# Patient Record
Sex: Female | Born: 1970 | Race: Black or African American | Hispanic: No | Marital: Married | Smoking: Never smoker
Health system: Southern US, Community
[De-identification: ages and names within clinical notes are randomized; demographics above are authoritative.]

## PROBLEM LIST (undated history)

## (undated) DIAGNOSIS — G7000 Myasthenia gravis without (acute) exacerbation: Secondary | ICD-10-CM

---

## 2003-02-16 HISTORY — PX: ABDOMINAL HYSTERECTOMY: SHX81

## 2013-02-15 HISTORY — PX: HERNIA REPAIR: SHX51

## 2013-10-18 ENCOUNTER — Encounter (HOSPITAL_BASED_OUTPATIENT_CLINIC_OR_DEPARTMENT_OTHER): Payer: Self-pay | Admitting: Emergency Medicine

## 2013-10-18 ENCOUNTER — Emergency Department (HOSPITAL_BASED_OUTPATIENT_CLINIC_OR_DEPARTMENT_OTHER)
Admission: EM | Admit: 2013-10-18 | Discharge: 2013-10-18 | Disposition: A | Discharge status: Home / Self Care | Payer: Medicare Other | Attending: Emergency Medicine | Admitting: Emergency Medicine

## 2013-10-18 ENCOUNTER — Emergency Department (HOSPITAL_BASED_OUTPATIENT_CLINIC_OR_DEPARTMENT_OTHER): Payer: Medicare Other

## 2013-10-18 DIAGNOSIS — Y92838 Other recreation area as the place of occurrence of the external cause: Secondary | ICD-10-CM

## 2013-10-18 DIAGNOSIS — Y9367 Activity, basketball: Secondary | ICD-10-CM | POA: Insufficient documentation

## 2013-10-18 DIAGNOSIS — S4980XA Other specified injuries of shoulder and upper arm, unspecified arm, initial encounter: Secondary | ICD-10-CM | POA: Diagnosis present

## 2013-10-18 DIAGNOSIS — Z8669 Personal history of other diseases of the nervous system and sense organs: Secondary | ICD-10-CM | POA: Diagnosis not present

## 2013-10-18 DIAGNOSIS — Z792 Long term (current) use of antibiotics: Secondary | ICD-10-CM | POA: Diagnosis not present

## 2013-10-18 DIAGNOSIS — S63509A Unspecified sprain of unspecified wrist, initial encounter: Secondary | ICD-10-CM | POA: Insufficient documentation

## 2013-10-18 DIAGNOSIS — Y9239 Other specified sports and athletic area as the place of occurrence of the external cause: Secondary | ICD-10-CM | POA: Diagnosis not present

## 2013-10-18 DIAGNOSIS — S63502A Unspecified sprain of left wrist, initial encounter: Secondary | ICD-10-CM

## 2013-10-18 DIAGNOSIS — W219XXA Striking against or struck by unspecified sports equipment, initial encounter: Secondary | ICD-10-CM | POA: Insufficient documentation

## 2013-10-18 DIAGNOSIS — S46909A Unspecified injury of unspecified muscle, fascia and tendon at shoulder and upper arm level, unspecified arm, initial encounter: Secondary | ICD-10-CM | POA: Insufficient documentation

## 2013-10-18 HISTORY — DX: Myasthenia gravis without (acute) exacerbation: G70.00

## 2013-10-18 MED ORDER — HYDROCODONE-ACETAMINOPHEN 5-325 MG PO TABS
1.0000 | ORAL_TABLET | Freq: Once | ORAL | Status: AC
  Administered 2013-10-18: 1 via ORAL
  Filled 2013-10-18: qty 1

## 2013-10-18 MED ORDER — IBUPROFEN 800 MG PO TABS
800.0000 mg | ORAL_TABLET | Freq: Once | ORAL | Status: AC
  Administered 2013-10-18: 800 mg via ORAL
  Filled 2013-10-18: qty 1

## 2013-10-18 MED ORDER — IBUPROFEN 800 MG PO TABS: 800.0000 mg | ORAL_TABLET | Freq: Three times a day (TID) | ORAL | Status: DC

## 2013-10-18 MED ORDER — HYDROCODONE-ACETAMINOPHEN 5-325 MG PO TABS: 1.0000 | ORAL_TABLET | ORAL | Status: DC | PRN

## 2013-10-18 NOTE — ED Notes (Signed)
Pt with h/o myastenia gravis , playing ball with son this pm, legs gave out and pt fell on left arm

## 2013-10-18 NOTE — ED Notes (Signed)
Pt legs gave way last pm  Fell  Abrasion to left shoulder, bilateral knees and pain to left hand and wrist w slight swelling

## 2013-10-18 NOTE — ED Provider Notes (Signed)
CSN: [id number]     Arrival date & time 10/18/13  0301 History   First MD Initiated Contact with Patient 10/18/13 0310     Chief Complaint  Patient presents with  . Arm Pain     (Consider location/radiation/quality/duration/timing/severity/associated sxs/prior Treatment) HPI  Patient presents to the ER for evaluation of left hand and wrist injury. Patient reports that she fell while playing basketball this evening. She had pain in the hand and wrist after the fall, but it was tolerable. She reports that she woke up this morning, however, with increased pain. Pain is constant, moderate to severe. It worsens when she tries to move the wrist.  Patient reports that she fell because her legs gave out. She does have a history of myasthenia gravis. There was no head injury. Patient denies neck and back pain. She does have scrapes on both of her knees, but the knees do not hurt.  Past Medical History  Diagnosis Date  . Myasthenia gravis    History reviewed. No pertinent past surgical history. History reviewed. No pertinent family history. History  Substance Use Topics  . Smoking status: Never Smoker   . Smokeless tobacco: Not on file  . Alcohol Use: No   OB History   Grav Para Term Preterm Abortions TAB SAB Ect Mult Living                 Review of Systems  Musculoskeletal: Positive for arthralgias. Negative for back pain and neck pain.  Skin:       Abrasion  All other systems reviewed and are negative.     Allergies  Review of patient's allergies indicates no known allergies.  Home Medications   Prior to Admission medications   Medication Sig Start Date End Date Taking? Authorizing Provider  doxycycline (VIBRAMYCIN) 50 MG capsule Take 50 mg by mouth 2 (two) times daily.   Yes Historical Provider, MD   BP 141/92  Pulse 92  Temp(Src) 98.8 F (37.1 C) (Oral)  Resp 18  Ht  (1.702 m)  Wt 158 lb (71.668 kg)  BMI 24.74 kg/m2  SpO2 100% Physical Exam  Constitutional:  She is oriented to person, place, and time. She appears well-developed and well-nourished. No distress.  HENT:  Head: Normocephalic and atraumatic.  Right Ear: Hearing normal.  Left Ear: Hearing normal.  Nose: Nose normal.  Mouth/Throat: Oropharynx is clear and moist and mucous membranes are normal.  Eyes: Conjunctivae and EOM are normal. Pupils are equal, round, and reactive to light.  Neck: Normal range of motion. Neck supple. No spinous process tenderness and no muscular tenderness present.  Cardiovascular: Regular rhythm, S1 normal and S2 normal.  Exam reveals no gallop and no friction rub.   No murmur heard. Pulmonary/Chest: Effort normal and breath sounds normal. No respiratory distress. She exhibits no tenderness.  Abdominal: Soft. Normal appearance and bowel sounds are normal. There is no hepatosplenomegaly. There is no tenderness. There is no rebound, no guarding, no tenderness at McBurney's point and negative Murphy's sign. No hernia.  Musculoskeletal: Normal range of motion.       Left wrist: She exhibits tenderness. She exhibits no swelling and no deformity.       Right knee: She exhibits normal range of motion, no swelling, no effusion, no ecchymosis and no deformity. No tenderness found.       Left knee: She exhibits normal range of motion, no swelling, no effusion, no ecchymosis and no deformity. No tenderness found.  Arms:      Left hand: She exhibits tenderness. She exhibits normal capillary refill, no deformity, no laceration and no swelling.       Hands: Neurological: She is alert and oriented to person, place, and time. She has normal strength. No cranial nerve deficit or sensory deficit. Coordination normal. GCS eye subscore is 4. GCS verbal subscore is 5. GCS motor subscore is 6.  Skin: Skin is warm and dry. Abrasion noted. No rash noted. No cyanosis.     Psychiatric: She has a normal mood and affect. Her speech is normal and behavior is normal. Thought content  normal.    ED Course  Procedures (including critical care time) Labs Review Labs Reviewed - No data to display  Imaging Review Dg Wrist Complete Left  10/18/2013   CLINICAL DATA:  Fall, left hand and wrist pain.  EXAM: LEFT HAND - COMPLETE 3+ VIEW; LEFT WRIST - COMPLETE 3+ VIEW  COMPARISON:  None.  FINDINGS: Left hand: Diffuse osteopenia. Mild first CMC DJD. No displaced acute fracture or dislocation.  Left wrist: No displaced fracture or dislocation. Diffuse osteopenia. No aggressive osseous lesion.  IMPRESSION: Mild first CMC DJD.  Diffuse osteopenia. No acute osseous finding of the left hand or wrist.  Recommend a repeat radiograph in 7-10 days if concern for acute fracture persists, to evaluate for interval change or callus formation.   Electronically Signed   By: Jearld Lesch M.D.   On: 10/18/2013 04:20   Dg Hand Complete Left  10/18/2013   CLINICAL DATA:  Fall, left hand and wrist pain.  EXAM: LEFT HAND - COMPLETE 3+ VIEW; LEFT WRIST - COMPLETE 3+ VIEW  COMPARISON:  None.  FINDINGS: Left hand: Diffuse osteopenia. Mild first CMC DJD. No displaced acute fracture or dislocation.  Left wrist: No displaced fracture or dislocation. Diffuse osteopenia. No aggressive osseous lesion.  IMPRESSION: Mild first CMC DJD.  Diffuse osteopenia. No acute osseous finding of the left hand or wrist.  Recommend a repeat radiograph in 7-10 days if concern for acute fracture persists, to evaluate for interval change or callus formation.   Electronically Signed   By: Jearld Lesch M.D.   On: 10/18/2013 04:20     EKG Interpretation None      MDM   Final diagnoses:  None   left wrist sprain, rule out occult fracture  Patient presents to the ER for evaluation of pain and tenderness of the left wrist and hand after a fall. There was no deformity noted. Patient is neurovascularly intact. Examination does reveal mild snuff box tenderness. X-ray does not show any acute fracture in the hand or wrist. Based on  the amount of pain in the areas of tenderness, cannot rule out occult fracture. Patient placed in a thumb spica splint, will followup with hand surgery or sports medicine for recheck of possible repeat imaging if not improving.    Gilda Crease, MD 10/18/13 [phone]

## 2013-10-18 NOTE — Discharge Instructions (Signed)
Sprain °A sprain is a tear in one of the strong, fibrous tissues that connect your bones (ligaments). The severity of the sprain depends on how much of the ligament is torn. The tear can be either partial or complete. °CAUSES  °Often, sprains are a result of a fall or an injury. The force of the impact causes the fibers of your ligament to stretch beyond their normal length. This excess tension causes the fibers of your ligament to tear. °SYMPTOMS  °You may have some loss of motion or increased pain within your normal range of motion. Other symptoms include: °· Bruising. °· Tenderness. °· Swelling. °DIAGNOSIS  °In order to diagnose a sprain, your caregiver will physically examine you to determine how torn the ligament is. Your caregiver may also suggest an X-ray exam to make sure no bones are broken. °TREATMENT  °If your ligament is only partially torn, treatment usually involves keeping the injured area in a fixed position (immobilization) for a short period. To do this, your caregiver will apply a bandage, cast, or splint to keep the area from moving until it heals. For a partially torn ligament, the healing process usually takes 2 to 3 weeks. °If your ligament is completely torn, you may need surgery to reconnect the ligament to the bone or to reconstruct the ligament. After surgery, a cast or splint may be applied and will need to stay on for 4 to 6 weeks while your ligament heals. °HOME CARE INSTRUCTIONS °· Keep the injured area elevated to decrease swelling. °· To ease pain and swelling, apply ice to your joint twice a day, for 2 to 3 days. °¨ Put ice in a plastic bag. °¨ Place a towel between your skin and the bag. °¨ Leave the ice on for 15 minutes. °· Only take over-the-counter or prescription medicine for pain as directed by your caregiver. °· Do not leave the injured area unprotected until pain and stiffness go away (usually 3 to 4 weeks). °· Do not allow your cast or splint to get wet. Cover your cast or  splint with a plastic bag when you shower or bathe. Do not swim. °· Your caregiver may suggest exercises for you to do during your recovery to prevent or limit permanent stiffness. °SEEK IMMEDIATE MEDICAL CARE IF: °· Your cast or splint becomes damaged. °· Your pain becomes worse. °MAKE SURE YOU: °· Understand these instructions. °· Will watch your condition. °· Will get help right away if you are not doing well or get worse. °Document Released: 01/30/2000 Document Revised: 04/26/2011 Document Reviewed: 02/13/2011 °Exit[employer]® Patient Information ©2015 [employer]. This information is not intended to replace advice given to you by your health care provider. Make sure you discuss any questions you have with your health care provider. ° °

## 2014-02-15 HISTORY — PX: THYMECTOMY: SHX1063

## 2015-07-09 ENCOUNTER — Emergency Department (HOSPITAL_BASED_OUTPATIENT_CLINIC_OR_DEPARTMENT_OTHER)
Admission: EM | Admit: 2015-07-09 | Discharge: 2015-07-09 | Disposition: A | Discharge status: Home / Self Care | Payer: Medicare Other | Attending: Emergency Medicine | Admitting: Emergency Medicine

## 2015-07-09 ENCOUNTER — Emergency Department (HOSPITAL_BASED_OUTPATIENT_CLINIC_OR_DEPARTMENT_OTHER): Payer: Medicare Other

## 2015-07-09 ENCOUNTER — Encounter (HOSPITAL_BASED_OUTPATIENT_CLINIC_OR_DEPARTMENT_OTHER): Payer: Self-pay | Admitting: Emergency Medicine

## 2015-07-09 DIAGNOSIS — Z79899 Other long term (current) drug therapy: Secondary | ICD-10-CM | POA: Diagnosis not present

## 2015-07-09 DIAGNOSIS — R1031 Right lower quadrant pain: Secondary | ICD-10-CM

## 2015-07-09 DIAGNOSIS — N83209 Unspecified ovarian cyst, unspecified side: Secondary | ICD-10-CM

## 2015-07-09 DIAGNOSIS — N83201 Unspecified ovarian cyst, right side: Secondary | ICD-10-CM | POA: Diagnosis not present

## 2015-07-09 LAB — COMPREHENSIVE METABOLIC PANEL
ALBUMIN: 3.9 g/dL (ref 3.5–5.0)
ALT: 17 U/L (ref 14–54)
ANION GAP: 7 (ref 5–15)
AST: 17 U/L (ref 15–41)
Alkaline Phosphatase: 61 U/L (ref 38–126)
BUN: 9 mg/dL (ref 6–20)
CO2: 27 mmol/L (ref 22–32)
CREATININE: 0.89 mg/dL (ref 0.44–1.00)
Calcium: 9 mg/dL (ref 8.9–10.3)
Chloride: 103 mmol/L (ref 101–111)
GFR calc Af Amer: >60 mL/min (ref >60)
GFR calc non Af Amer: >60 mL/min (ref >60)
Glucose, Bld: 109 mg/dL — ABNORMAL HIGH (ref 65–99)
Potassium: 3.2 mmol/L — ABNORMAL LOW (ref 3.5–5.1)
SODIUM: 137 mmol/L (ref 135–145)
TOTAL PROTEIN: 7.5 g/dL (ref 6.5–8.1)
Total Bilirubin: 0.9 mg/dL (ref 0.3–1.2)

## 2015-07-09 LAB — CBC WITH DIFFERENTIAL/PLATELET
BASOS PCT: 0 %
Basophils Absolute: 0 10*3/uL (ref 0.0–0.1)
EOS PCT: 0 %
Eosinophils Absolute: 0 10*3/uL (ref 0.0–0.7)
HCT: 40.3 % (ref 36.0–46.0)
HEMOGLOBIN: 13.8 g/dL (ref 12.0–15.0)
Lymphocytes Relative: 16 %
Lymphs Abs: 1.1 10*3/uL (ref 0.7–4.0)
MCH: 32.3 pg (ref 26.0–34.0)
MCHC: 34.2 g/dL (ref 30.0–36.0)
MCV: 94.4 fL (ref 78.0–100.0)
MONOS PCT: 13 %
Monocytes Absolute: 0.9 10*3/uL (ref 0.1–1.0)
NEUTROS PCT: 71 %
Neutro Abs: 4.9 10*3/uL (ref 1.7–7.7)
PLATELETS: 229 10*3/uL (ref 150–400)
RBC: 4.27 MIL/uL (ref 3.87–5.11)
RDW: 13.2 % (ref 11.5–15.5)
WBC: 6.9 10*3/uL (ref 4.0–10.5)

## 2015-07-09 LAB — URINALYSIS, ROUTINE W REFLEX MICROSCOPIC
BILIRUBIN URINE: NEGATIVE
Glucose, UA: NEGATIVE mg/dL
Hgb urine dipstick: NEGATIVE
Ketones, ur: 15 mg/dL — AB
Leukocytes, UA: NEGATIVE
NITRITE: NEGATIVE
Protein, ur: NEGATIVE mg/dL
SPECIFIC GRAVITY, URINE: 1.023 (ref 1.005–1.030)
pH: 6 (ref 5.0–8.0)

## 2015-07-09 LAB — LIPASE, BLOOD: Lipase: 16 U/L (ref 11–51)

## 2015-07-09 MED ORDER — HYDROCODONE-ACETAMINOPHEN 5-325 MG PO TABS
1.0000 | ORAL_TABLET | Freq: Four times a day (QID) | ORAL | Status: AC | PRN
Start: 2015-07-09 — End: ?

## 2015-07-09 MED FILL — HYDROCODON-APAP 5-325: 5-325 | 2 days supply | Qty: 15 | Fill #0

## 2015-07-09 NOTE — ED Notes (Addendum)
44 yof PMHx Myasthenia gravis (s/p thymectomy) and umbilical hernia repair presents with RLQ abd pain since ~ 1am Tuesday morning. States the pain caused her to wake up. "sharp squeezing pain" has progressively worsened. Developed nausea, vomiting x 3, loss of appetite. +chills.   No fever, vag/urinary symptoms.   Last meal : yesterday 7pm smoothie. NPO since midnight.

## 2015-07-09 NOTE — Discharge Instructions (Signed)
Hydrocodone as prescribed as needed for pain.  Return to the emergency department if you develop worsening pain, high fever, bloody stool, or other new and concerning symptoms.   Abdominal Pain, Adult Many things can cause abdominal pain. Usually, abdominal pain is not caused by a disease and will improve without treatment. It can often be observed and treated at home. Your health care provider will do a physical exam and possibly order blood tests and X-rays to help determine the seriousness of your pain. However, in many cases, more time must pass before a clear cause of the pain can be found. Before that point, your health care provider may not know if you need more testing or further treatment. HOME CARE INSTRUCTIONS Monitor your abdominal pain for any changes. The following actions may help to alleviate any discomfort you are experiencing:  Only take over-the-counter or prescription medicines as directed by your health care provider.  Do not take laxatives unless directed to do so by your health care provider.  Try a clear liquid diet (broth, tea, or water) as directed by your health care provider. Slowly move to a bland diet as tolerated. SEEK MEDICAL CARE IF:  You have unexplained abdominal pain.  You have abdominal pain associated with nausea or diarrhea.  You have pain when you urinate or have a bowel movement.  You experience abdominal pain that wakes you in the night.  You have abdominal pain that is worsened or improved by eating food.  You have abdominal pain that is worsened with eating fatty foods.  You have a fever. SEEK IMMEDIATE MEDICAL CARE IF:  Your pain does not go away within 2 hours.  You keep throwing up (vomiting).  Your pain is felt only in portions of the abdomen, such as the right side or the left lower portion of the abdomen.  You pass bloody or black tarry stools. MAKE SURE YOU:  Understand these instructions.  Will watch your  condition.  Will get help right away if you are not doing well or get worse.   This information is not intended to replace advice given to you by your health care provider. Make sure you discuss any questions you have with your health care provider.   Document Released: 11/11/2004 Document Revised: 10/23/2014 Document Reviewed: 10/11/2012 Elsevier Interactive Patient Education [employer]2016 [employer]

## 2015-07-09 NOTE — ED Provider Notes (Signed)
CSN: [id number]     Arrival date & time 07/09/15  0803 History   First MD Initiated Contact with Patient 07/09/15 0815     Chief Complaint  Patient presents with  . Abdominal Pain     (Consider location/radiation/quality/duration/timing/severity/associated sxs/prior Treatment) HPI Comments: Patient is a 45 year old female with history of myasthenia gravis and prior hysterectomy. She presents for evaluation of right lower abdominal pain that started 2 days ago in the absence of any injury or trauma. She denies any fevers or chills. She denies any vaginal discharge. She denies any bowel complaints but does report feeling as though she does not empty completely when she urinates. She denies a history of prior UTI.  Patient is a 45 y.o. female presenting with abdominal pain. The history is provided by the patient.  Abdominal Pain Pain location:  RLQ Pain quality: cramping   Pain radiates to:  Does not radiate Pain severity:  Moderate Onset quality:  Sudden Duration:  2 days Timing:  Constant Progression:  Worsening Chronicity:  New Relieved by:  Nothing Worsened by:  Nothing tried Ineffective treatments:  None tried Associated symptoms: dysuria   Associated symptoms: no chills, no constipation, no diarrhea, no fever, no hematuria and no nausea     Past Medical History  Diagnosis Date  . Myasthenia gravis [facility](HCC)    Past Surgical History  Procedure Laterality Date  . Thymectomy  2016  . Abdominal hysterectomy  2005  . Hernia repair  2015    Umbilical Hernia Repair ?unsure if mesh placed   History reviewed. No pertinent family history. Social History  Substance Use Topics  . Smoking status: Never Smoker   . Smokeless tobacco: None  . Alcohol Use: No   OB History    No data available     Review of Systems  Constitutional: Negative for fever and chills.  Gastrointestinal: Positive for abdominal pain. Negative for nausea, diarrhea and constipation.  Genitourinary: Positive  for dysuria. Negative for hematuria.  All other systems reviewed and are negative.     Allergies  Review of patient's allergies indicates no known allergies.  Home Medications   Prior to Admission medications   Medication Sig Start Date End Date Taking? Authorizing Provider  Calcium Citrate-Vitamin D (CALCIUM + D PO) Take by mouth.   Yes Historical Provider, MD  Inulin (FIBER CHOICE PO) Take by mouth.   Yes Historical Provider, MD   BP 115/84 mmHg  Pulse 81  Temp(Src) 98.3 F (36.8 C) (Oral)  Resp 18  Ht 5\' 7"  (1.702 m)  Wt 168 lb (76.204 kg)  BMI 26.31 kg/m2  SpO2 100% Physical Exam  Constitutional: She is oriented to person, place, and time. She appears well-developed and well-nourished. No distress.  HENT:  Head: Normocephalic and atraumatic.  Neck: Normal range of motion. Neck supple.  Cardiovascular: Normal rate and regular rhythm.  Exam reveals no gallop and no friction rub.   No murmur heard. Pulmonary/Chest: Effort normal and breath sounds normal. No respiratory distress. She has no wheezes.  Abdominal: Soft. Bowel sounds are normal. She exhibits no distension. There is tenderness. There is no rebound and no guarding.  There is tenderness to palpation in the right lower quadrant.  Musculoskeletal: Normal range of motion.  Neurological: She is alert and oriented to person, place, and time.  Skin: Skin is warm and dry. She is not diaphoretic.  Nursing note and vitals reviewed.   ED Course  Procedures (including critical care time) Labs Review Labs Reviewed  COMPREHENSIVE METABOLIC PANEL  LIPASE, BLOOD  CBC WITH DIFFERENTIAL/PLATELET  URINALYSIS, ROUTINE W REFLEX MICROSCOPIC (NOT AT [facility])    Imaging Review No results found. I have personally reviewed and evaluated these images and lab results as part of my medical decision-making.    MDM   Final diagnoses:  None    Ultrasound reveals what appears to be a hemorrhagic cyst of the right ovary. She has  no white count and normal LFTs. Her symptoms are not consistent with a gallbladder or an appendix. She will be discharged with pain medication and observation. If she is worsening, she is to return to be reevaluated.    Geoffery Lyons, MD 07/09/15 [phone]

## 2015-08-03 IMAGING — CR DG WRIST COMPLETE 3+V*L*
4 series · 4 of 4 positions shown · non-contrast
Comparison: None.

CLINICAL DATA: Fall, left hand and wrist pain.

EXAM:
LEFT HAND - COMPLETE 3+ VIEW; LEFT WRIST - COMPLETE 3+ VIEW

[x wrist lat left]
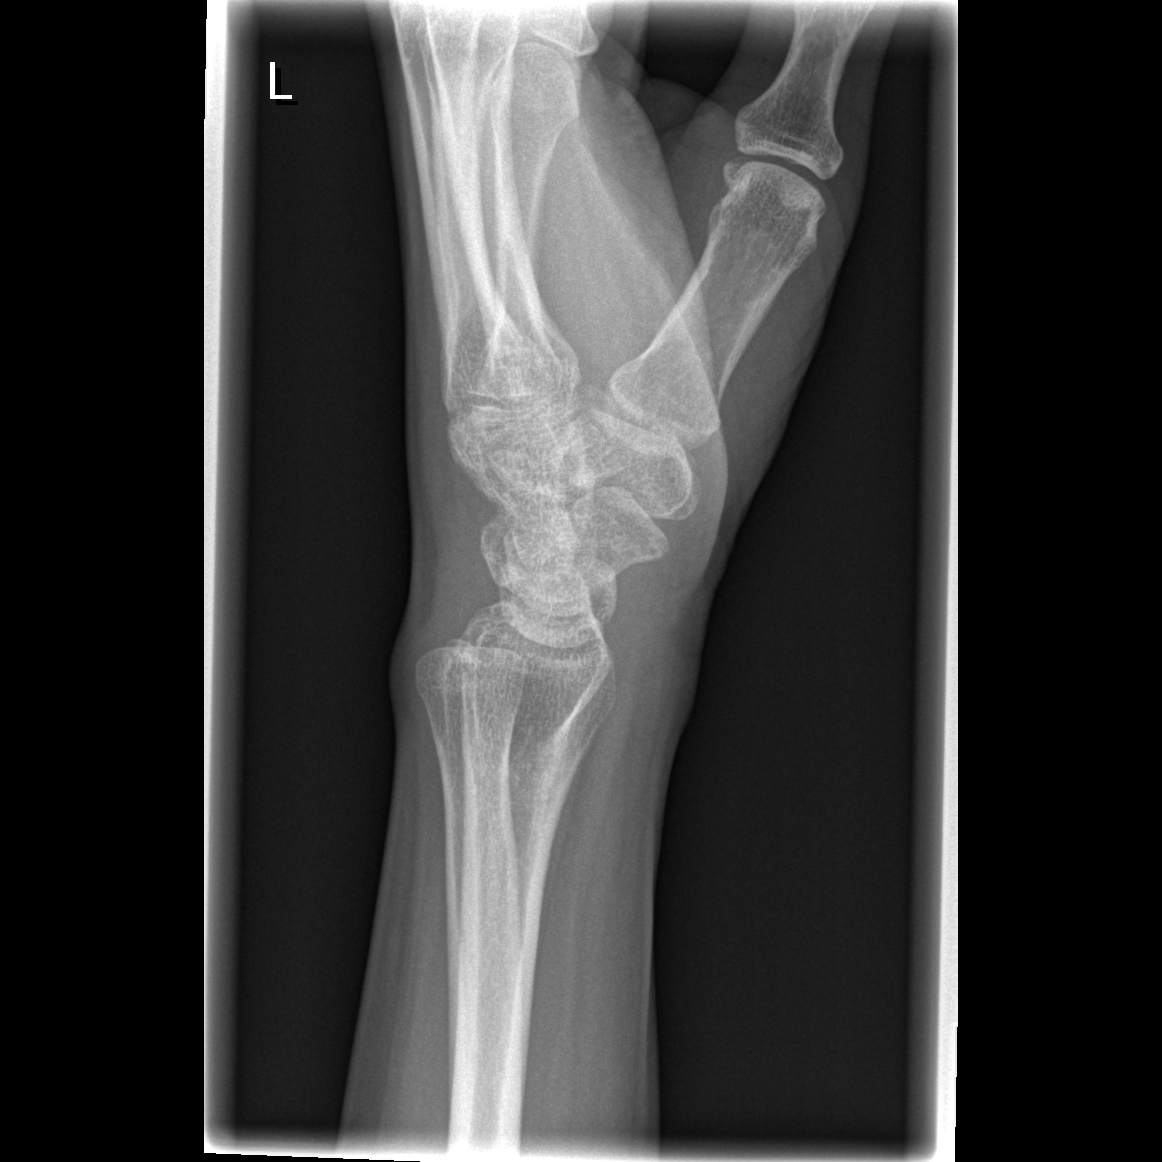

[x wrist obl left]
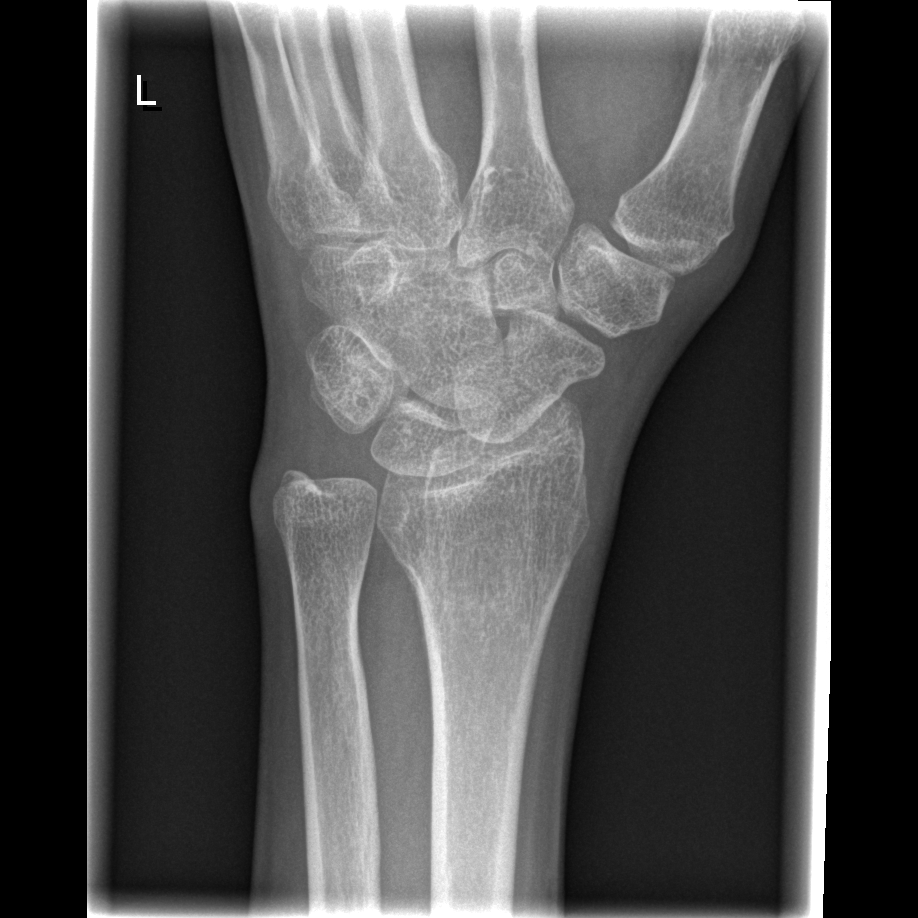

[x wrist pa left]
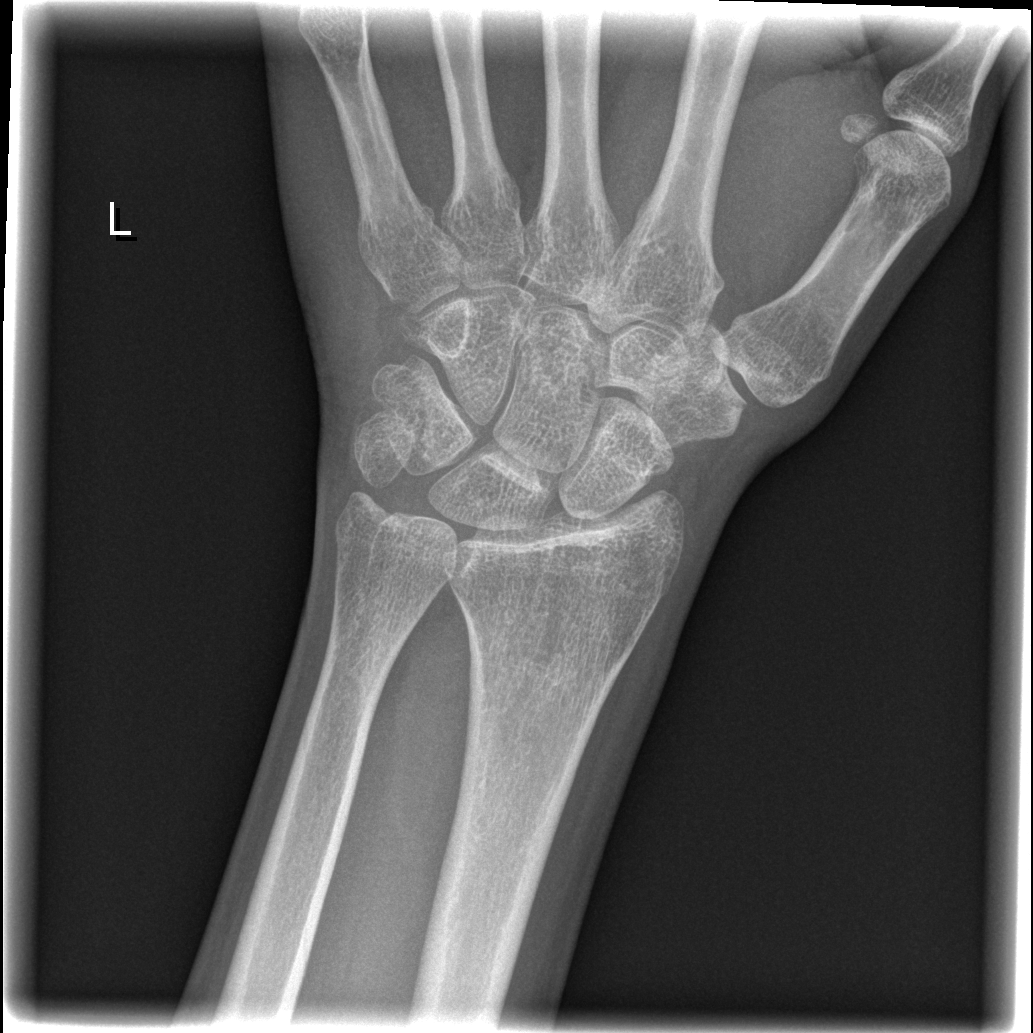

[x navicular]
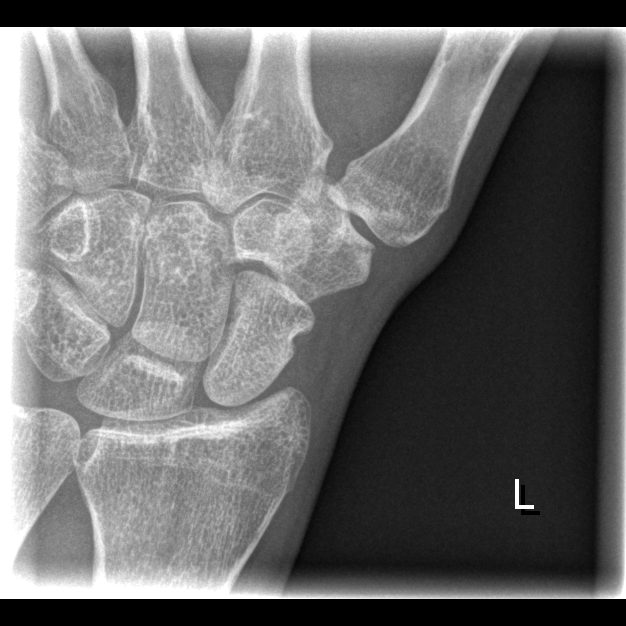

[4 of 4 positions shown; findings below may reference images not displayed]

FINDINGS: Left hand: Diffuse osteopenia. Mild first CMC DJD. No displaced
acute fracture or dislocation.

Left wrist: No displaced fracture or dislocation. Diffuse
osteopenia. No aggressive osseous lesion.
IMPRESSION: Mild first CMC DJD.

Diffuse osteopenia. No acute osseous finding of the left hand or
wrist.

Recommend a repeat radiograph in 7-10 days if concern for acute
fracture persists, to evaluate for interval change or callus
formation.

## 2015-08-03 IMAGING — CR DG HAND COMPLETE 3+V*L*
3 series · 3 of 3 positions shown · non-contrast
Comparison: None.

CLINICAL DATA: Fall, left hand and wrist pain.

EXAM:
LEFT HAND - COMPLETE 3+ VIEW; LEFT WRIST - COMPLETE 3+ VIEW

[x hand lat left]
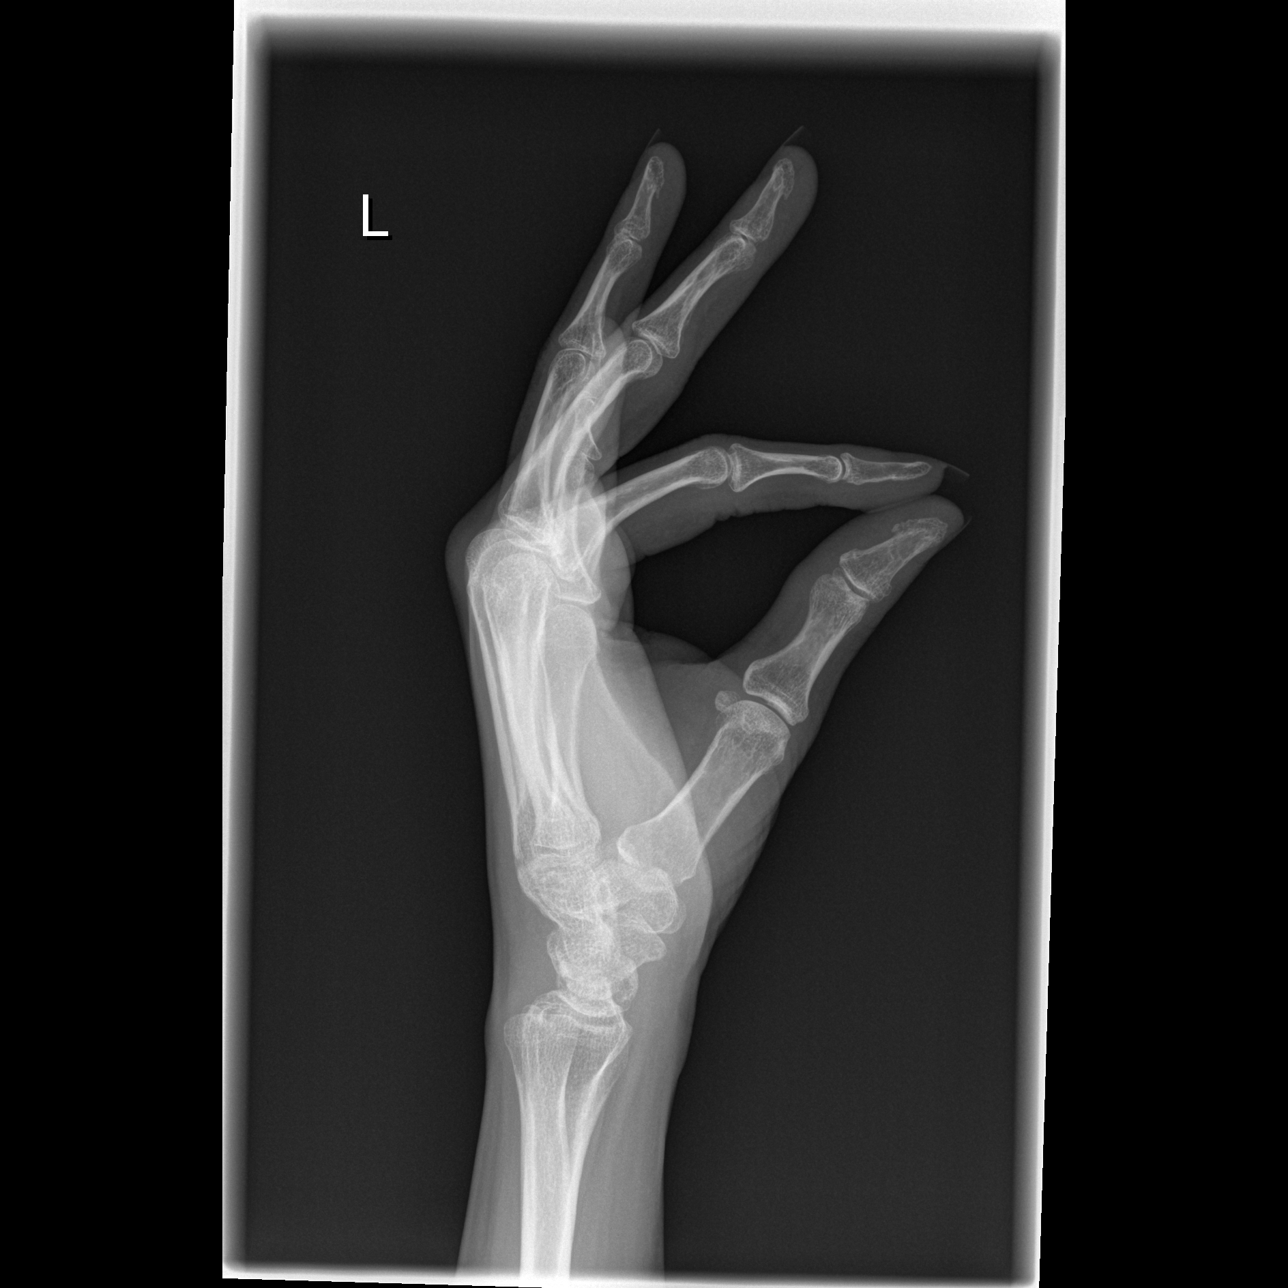

[x hand pa left]
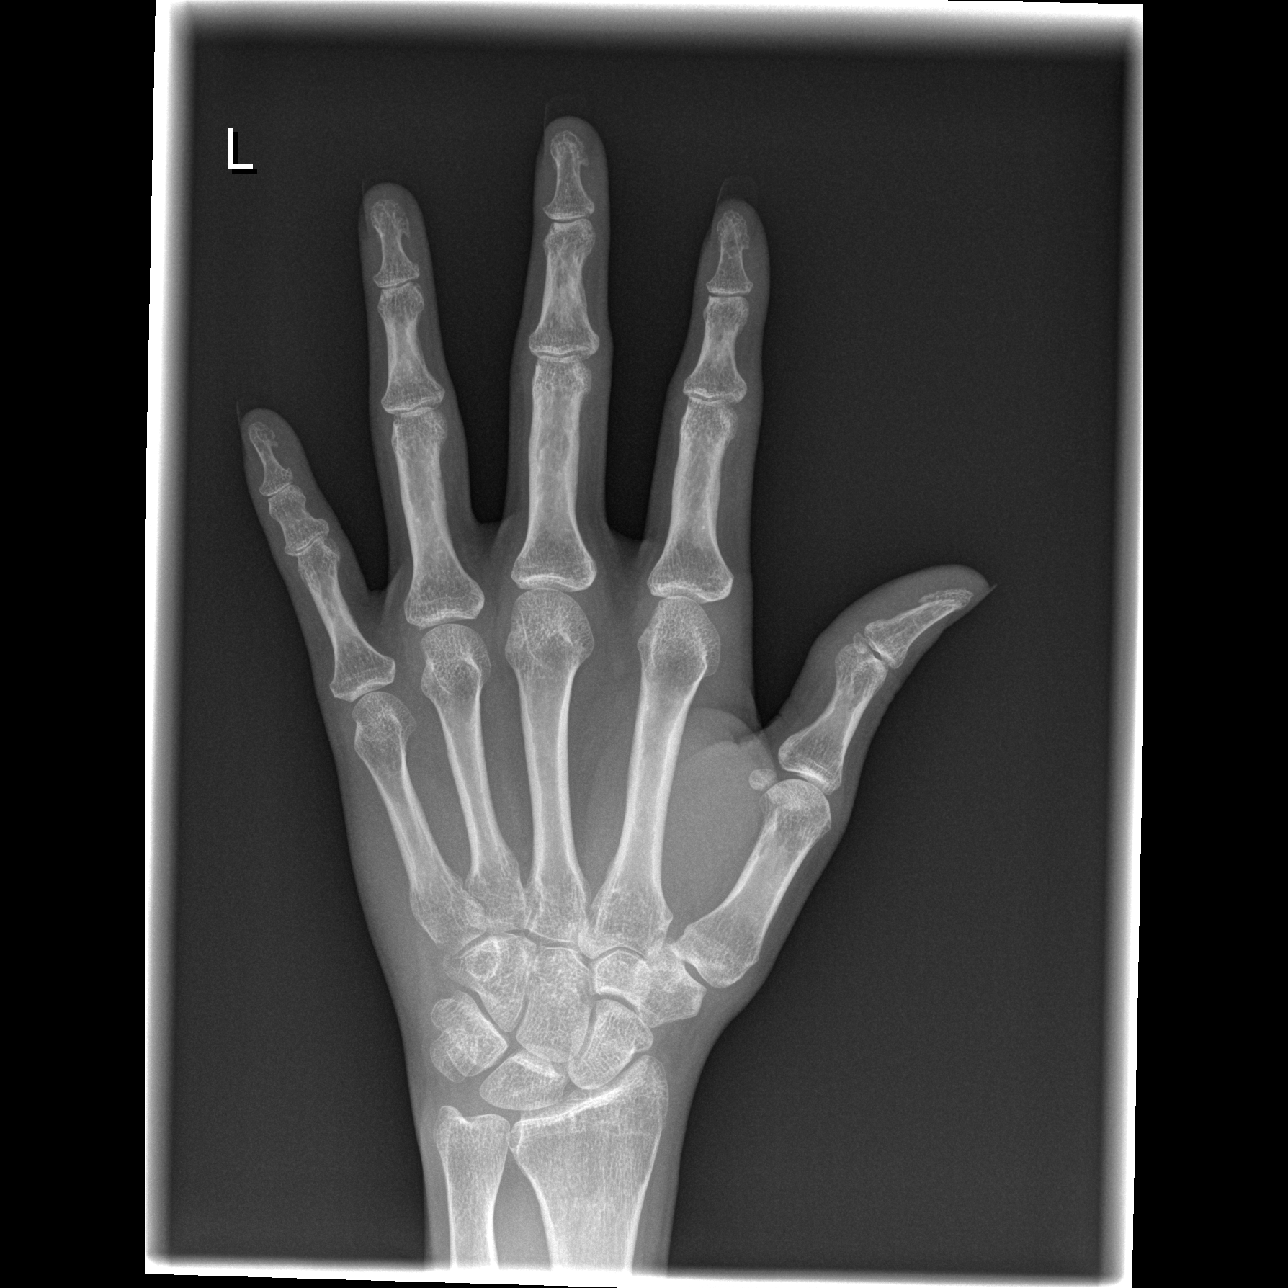

[x hand oblique left]
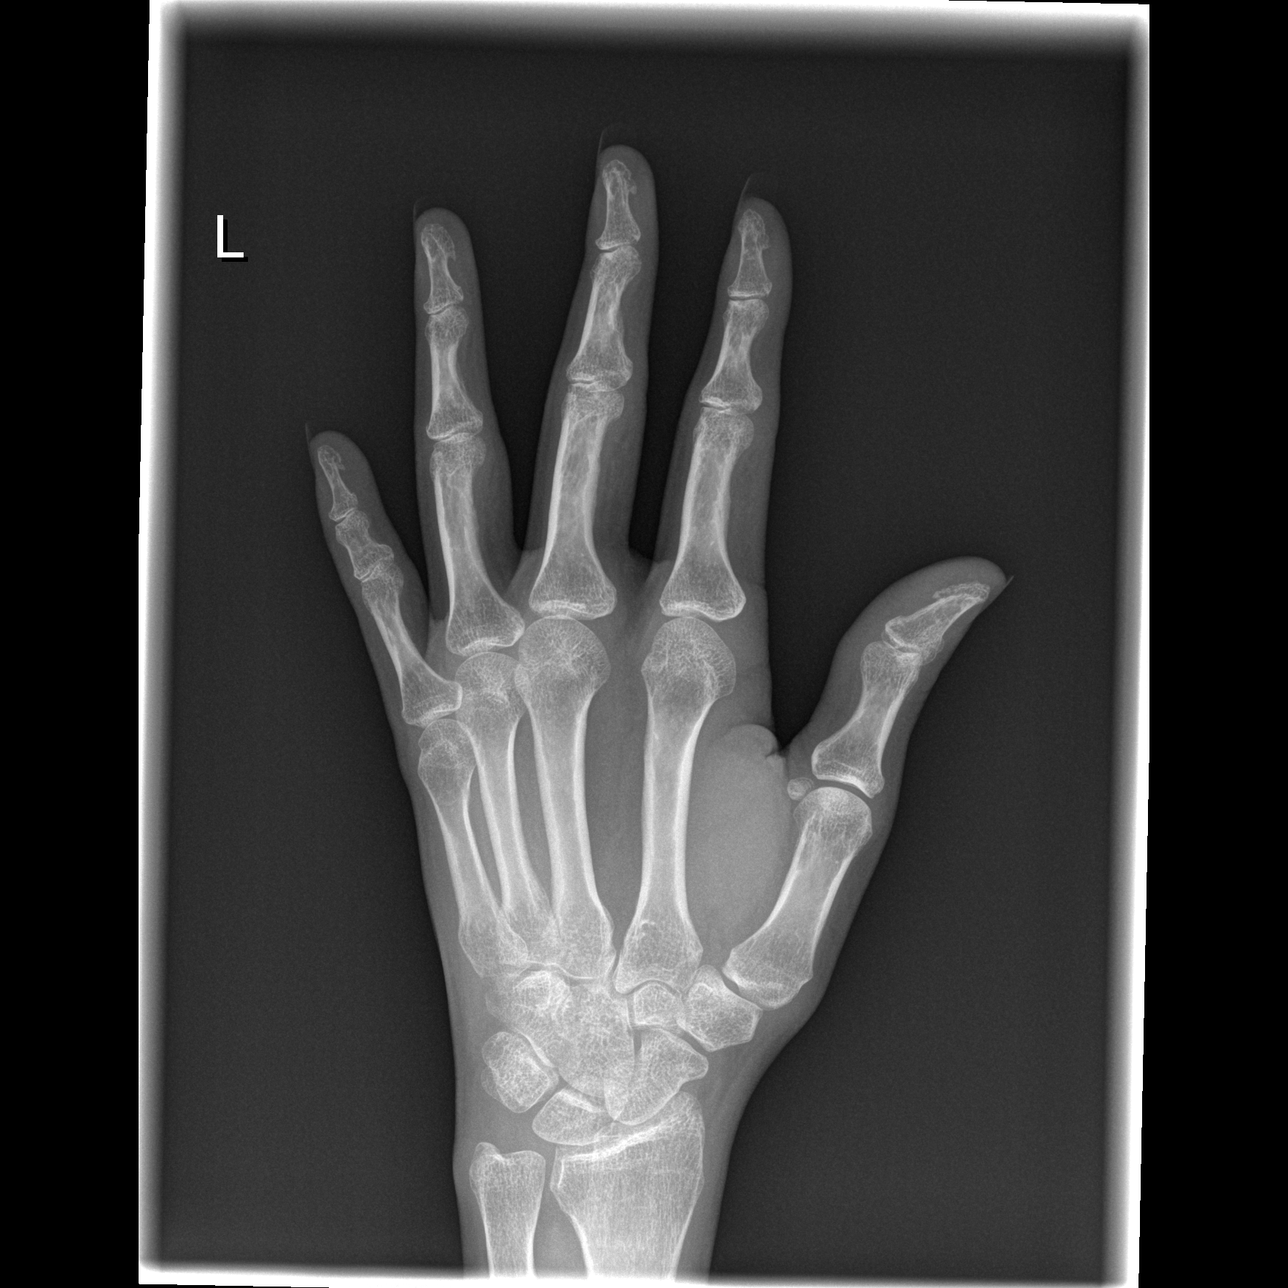

[3 of 3 positions shown; findings below may reference images not displayed]

FINDINGS: Left hand: Diffuse osteopenia. Mild first CMC DJD. No displaced
acute fracture or dislocation.

Left wrist: No displaced fracture or dislocation. Diffuse
osteopenia. No aggressive osseous lesion.
IMPRESSION: Mild first CMC DJD.

Diffuse osteopenia. No acute osseous finding of the left hand or
wrist.

Recommend a repeat radiograph in 7-10 days if concern for acute
fracture persists, to evaluate for interval change or callus
formation.

## 2020-10-05 ENCOUNTER — Other Ambulatory Visit: Payer: Self-pay

## 2020-10-05 ENCOUNTER — Emergency Department (HOSPITAL_BASED_OUTPATIENT_CLINIC_OR_DEPARTMENT_OTHER): Payer: Medicare Other

## 2020-10-05 ENCOUNTER — Emergency Department (HOSPITAL_BASED_OUTPATIENT_CLINIC_OR_DEPARTMENT_OTHER)
Admission: EM | Admit: 2020-10-05 | Discharge: 2020-10-05 | Disposition: A | Discharge status: Home / Self Care | Payer: Medicare Other | Attending: Emergency Medicine | Admitting: Emergency Medicine

## 2020-10-05 ENCOUNTER — Encounter (HOSPITAL_BASED_OUTPATIENT_CLINIC_OR_DEPARTMENT_OTHER): Payer: Self-pay | Admitting: Pharmacy Technician

## 2020-10-05 DIAGNOSIS — R202 Paresthesia of skin: Secondary | ICD-10-CM | POA: Insufficient documentation

## 2020-10-05 DIAGNOSIS — R0602 Shortness of breath: Secondary | ICD-10-CM | POA: Diagnosis present

## 2020-10-05 DIAGNOSIS — R519 Headache, unspecified: Secondary | ICD-10-CM | POA: Insufficient documentation

## 2020-10-05 DIAGNOSIS — R0789 Other chest pain: Secondary | ICD-10-CM | POA: Diagnosis present

## 2020-10-05 DIAGNOSIS — G7000 Myasthenia gravis without (acute) exacerbation: Secondary | ICD-10-CM | POA: Diagnosis not present

## 2020-10-05 LAB — CBC WITH DIFFERENTIAL/PLATELET
Abs Immature Granulocytes: 0 10*3/uL (ref 0.00–0.07)
Basophils Absolute: 0 10*3/uL (ref 0.0–0.1)
Basophils Relative: 1 %
Eosinophils Absolute: 0.1 10*3/uL (ref 0.0–0.5)
Eosinophils Relative: 2 %
HCT: 41.1 % (ref 36.0–46.0)
Hemoglobin: 13.6 g/dL (ref 12.0–15.0)
Immature Granulocytes: 0 %
Lymphocytes Relative: 33 %
Lymphs Abs: 0.9 10*3/uL (ref 0.7–4.0)
MCH: 30.1 pg (ref 26.0–34.0)
MCHC: 33.1 g/dL (ref 30.0–36.0)
MCV: 90.9 fL (ref 80.0–100.0)
Monocytes Absolute: 0.6 10*3/uL (ref 0.1–1.0)
Monocytes Relative: 21 %
Neutro Abs: 1.2 10*3/uL — ABNORMAL LOW (ref 1.7–7.7)
Neutrophils Relative %: 43 %
Platelets: 235 10*3/uL (ref 150–400)
RBC: 4.52 MIL/uL (ref 3.87–5.11)
RDW: 13.3 % (ref 11.5–15.5)
WBC: 2.8 10*3/uL — ABNORMAL LOW (ref 4.0–10.5)
nRBC: 0 % (ref 0.0–0.2)

## 2020-10-05 LAB — BASIC METABOLIC PANEL
Anion gap: 8 (ref 5–15)
BUN: 9 mg/dL (ref 6–20)
CO2: 28 mmol/L (ref 22–32)
Calcium: 9.5 mg/dL (ref 8.9–10.3)
Chloride: 101 mmol/L (ref 98–111)
Creatinine, Ser: 0.79 mg/dL (ref 0.44–1.00)
GFR, Estimated: >60 mL/min (ref >60)
Glucose, Bld: 110 mg/dL — ABNORMAL HIGH (ref 70–99)
Potassium: 3.9 mmol/L (ref 3.5–5.1)
Sodium: 137 mmol/L (ref 135–145)

## 2020-10-05 LAB — TROPONIN I (HIGH SENSITIVITY)
Troponin I (High Sensitivity): 2 ng/L (ref <18)
Troponin I (High Sensitivity): 2 ng/L (ref <18)

## 2020-10-05 MED ORDER — ALUM & MAG HYDROXIDE-SIMETH 200-200-20 MG/5ML PO SUSP
30.0000 mL | Freq: Once | ORAL | Status: AC
  Administered 2020-10-05: 30 mL via ORAL
  Filled 2020-10-05: qty 30

## 2020-10-05 MED ORDER — KETOROLAC TROMETHAMINE 15 MG/ML IJ SOLN
15.0000 mg | Freq: Once | INTRAMUSCULAR | Status: AC
  Administered 2020-10-05: 15 mg via INTRAVENOUS
  Filled 2020-10-05: qty 1

## 2020-10-05 NOTE — ED Notes (Signed)
Patient Alert and oriented to baseline. Stable and ambulatory to baseline. Patient verbalized understanding of the discharge instructions.  Patient belongings were taken by the patient.   

## 2020-10-05 NOTE — Discharge Instructions (Addendum)
Your work-up today was reassuring.  You will need to follow-up with your neurologist and primary care doctor if you continue to have symptoms.  You can try taking over-the-counter medications for gas to see if that helps.  Return to the ER if any new or worsening symptoms.

## 2020-10-05 NOTE — ED Notes (Signed)
NIF greater than -60 (only goes that high) No distress noted

## 2020-10-05 NOTE — ED Triage Notes (Signed)
Pt here with reports of chest pain onset Thursday after eating. Pt endorses pain to the L side of her chest and under her breast. Pt also reports having trouble gripping things with her L hand "for a while"/ Hx Myasthenia Gravis.

## 2020-10-05 NOTE — ED Provider Notes (Signed)
[facility] EMERGENCY DEPARTMENT Provider Note   CSN: [id number] Arrival date & time: 10/05/20  [id number]     History Chief Complaint  Patient presents with   Chest Pain    Jacqueline Pitts is a 50 y.o. female with past medical history of myasthenia gravis, thymectomy.  HPI Presents to emergency room today with chief complaint of chest pain x4 days.  Pain is constant and waxes and wanes in severity.  She states the pain is located under her left breast and radiates to the center of her chest.  She states when the pain first started she thought it was gas pain.  Pain started after eating a salad for dinner.  Patient is also endorsing pain in the back of her head.  She states she saw her eye doctor recently and was told that her optic nerve might be inflamed.  She was scheduled to see a specialist but her appointment is not until February 2023.  Patient also reports she is having numbness in her left hand.  This has been going on x6 days.  Patient states she has a history of weakness in the left hand however the numbness is new.  Patient [facility] neurology [facility] in January of this year.  She goes for yearly appointments.  Patient states she had IVIG over 4 years ago for her myasthenia.  She states she has never been intubated for this.  Patient said she took aspirin for her chest pain last night and she had some temporary improvement.  She denies any respiratory distress however states when the pain is severe it feels like she has difficulty breathing.  She denies shortness of breath or chest pain with exertion.  She states her chest pain is worse with movement.  She denies any known injury or recent heavy lifting.  She rates pain 7 out of 10 in severity.  She denies any recent illness.  Denies any fever, chills, cough, congestion, hemoptysis, hematemesis, syncope, palpitations, abdominal pain, nausea, vomiting, urinary symptoms, diarrhea. Denies tobacco abuse, history of diabetes.  Family cardiac history includes father having MI in his 3860s.    Past Medical History:  Diagnosis Date   Myasthenia gravis (HCC)     There are no problems to display for this patient.   Past Surgical History:  Procedure Laterality Date   ABDOMINAL HYSTERECTOMY  2005   HERNIA REPAIR  2015   Umbilical Hernia Repair ?unsure if mesh placed   THYMECTOMY  2016     OB History   No obstetric history on file.     No family history on file.  Social History   Tobacco Use   Smoking status: Never  Substance Use Topics   Alcohol use: No    Home Medications Prior to Admission medications   Medication Sig Start Date End Date Taking? Authorizing Provider  Calcium Citrate-Vitamin D (CALCIUM + D PO) Take by mouth.    [provider]  HYDROcodone-acetaminophen (NORCO) 5-325 MG tablet Take 1-2 tablets by mouth every 6 (six) hours as needed. 07/09/15   Geoffery Lyonselo, Douglas, MD  Inulin (FIBER CHOICE PO) Take by mouth.    [provider]    Allergies    Patient has no known allergies.  Review of Systems   Review of Systems All other systems are reviewed and are negative for acute change except as noted in the HPI.  Physical Exam Updated Vital Signs BP 125/83   Pulse 77   Temp 98.6 F (37  C) (Oral)   Resp 16   Ht 5\' 7"  (1.702 m)   Wt 76.2 kg   SpO2 100%   BMI 26.31 kg/m   Physical Exam Vitals and nursing note reviewed.  Constitutional:      General: She is not in acute distress.    Appearance: She is not ill-appearing.  HENT:     Head: Normocephalic and atraumatic.     Jaw: There is normal jaw occlusion.     Comments: No tenderness to palpation of skull. No deformities or crepitus noted. No open wounds, abrasions or lacerations.    Right Ear: Tympanic membrane and external ear normal. No mastoid tenderness. No hemotympanum.     Left Ear: Tympanic membrane and external ear normal. No mastoid tenderness. No hemotympanum.     Nose: Nose normal.     Mouth/Throat:      Mouth: Mucous membranes are moist.     Pharynx: Oropharynx is clear. Uvula midline. No uvula swelling.  Eyes:     General: No scleral icterus.       Right eye: No discharge.        Left eye: No discharge.     Extraocular Movements: Extraocular movements intact.     Conjunctiva/sclera: Conjunctivae normal.     Pupils: Pupils are equal, round, and reactive to light.  Neck:     Vascular: No JVD.  Cardiovascular:     Rate and Rhythm: Normal rate and regular rhythm.     Pulses: Normal pulses.          Radial pulses are 2+ on the right side and 2+ on the left side.       Dorsalis pedis pulses are 2+ on the right side and 2+ on the left side.     Heart sounds: Normal heart sounds.  Pulmonary:     Comments: Lungs clear to auscultation in all fields. Symmetric chest rise. No wheezing, rales, or rhonchi. Chest:       Comments: Tenderness to palpation as depicted in image above. No overlying skin changes. No crepitus, no flail chest or deformity. No rash. Abdominal:     Comments: Abdomen is soft, non-distended, and non-tender in all quadrants. No rigidity, no guarding. No peritoneal signs.  Musculoskeletal:        General: Normal range of motion.     Cervical back: Normal range of motion.     Right lower leg: No edema.     Left lower leg: No edema.     Comments: Compartments soft in all extremities.  Extremities are neurovascularly intact.  Skin:    General: Skin is warm and dry.     Capillary Refill: Capillary refill takes less than 2 seconds.     Findings: No rash.     Comments: Equal tactile temperature in all extremities.  Neurological:     Mental Status: She is oriented to person, place, and time.     GCS: GCS eye subscore is 4. GCS verbal subscore is 5. GCS motor subscore is 6.     Comments: Fluent speech, no facial droop.  Left upper extremity grip strength 4/5 and right upper extremity grip strength 5/5. Bilateral lower extremity grip strength 5/5.  Psychiatric:         Behavior: Behavior normal.    ED Results / Procedures / Treatments   Labs (all labs ordered are listed, but only abnormal results are displayed) Labs Reviewed  CBC WITH DIFFERENTIAL/PLATELET - Abnormal; Notable for the following components:  Result Value   WBC 2.8 (*)    Neutro Abs 1.2 (*)    All other components within normal limits  BASIC METABOLIC PANEL - Abnormal; Notable for the following components:   Glucose, Bld 110 (*)    All other components within normal limits  TROPONIN I (HIGH SENSITIVITY)  TROPONIN I (HIGH SENSITIVITY)    EKG EKG Interpretation  Date/Time:  Sunday October 05 2020 09:32:58 EDT Ventricular Rate:  84 PR Interval:  161 QRS Duration: 75 QT Interval:  328 QTC Calculation: 388 R Axis:   47 Text Interpretation: Sinus rhythm Probable left atrial enlargement No previous tracing Confirmed by Gwyneth Sprout ([id number]) on 10/05/2020 9:53:35 AM  Radiology DG Chest 2 View  Result Date: 10/05/2020 CLINICAL DATA:  Left-sided chest pain which began after eating 3 days ago and has persisted. EXAM: CHEST - 2 VIEW COMPARISON:  10/25/2014. FINDINGS: Normal heart, mediastinum and hila. Clear lungs.  No pleural effusion or pneumothorax. Skeletal structures are unremarkable. Surgical vascular clips project over the right superior mediastinum, stable. IMPRESSION: No active cardiopulmonary disease. Electronically Signed   By: Amie Portland M.D.   On: 10/05/2020 11:13   CT HEAD WO CONTRAST ( )  Result Date: 10/05/2020 CLINICAL DATA:  Left hand weakness. EXAM: CT HEAD WITHOUT CONTRAST TECHNIQUE: Contiguous axial images were obtained from the base of the skull through the vertex without intravenous contrast. COMPARISON:  None. FINDINGS: Brain: No evidence of acute infarction, hemorrhage, hydrocephalus, extra-axial collection, or mass lesion/mass effect. Vascular:  No hyperdense vessel or other acute findings. Skull: No evidence of fracture or other significant bone  abnormality. Sinuses/Orbits:  No acute findings. Other: None. IMPRESSION: Negative noncontrast head CT. Electronically Signed   By: Danae Orleans M.D.   On: 10/05/2020 11:42    Procedures Procedures   Medications Ordered in ED Medications  alum & mag hydroxide-simeth (MAALOX/MYLANTA) 200-200-20 MG/5ML suspension 30 mL (30 mLs Oral Given 10/05/20 1030)  ketorolac (TORADOL) 15 MG/ML injection 15 mg (15 mg Intravenous Given 10/05/20 1223)    ED Course  I have reviewed the triage vital signs and the nursing notes.  Pertinent labs & imaging results that were available during my care of the patient were reviewed by me and considered in my medical decision making (see chart for details).    MDM Rules/Calculators/A&P                           History provided by patient with additional history obtained from chart review.    Patient presents to the emergency department with chest pain. Patient nontoxic appearing, in no apparent distress, vitals without significant abnormality. Fairly benign physical exam. DDX: ACS, pulmonary embolism, dissection, pneumothorax, effusion, infiltrate, arrhythmia, anemia, electrolyte derangement, MSK. Evaluation initiated with labs, EKG, and CXR. Patient on cardiac monitor.  Patient also has nonspecific complaints of relating to myasthenia gravis. Consulted ED attending Dr. Anitra Lauth who agrees with plan for head CT and nif test. Both are unremarkable and therefore myasethina gravis crisis seems unlikely.   CBC with leukopenia 2.8, no anemia.  Chart review shows patient has had similar in the past.  BMP is overall unremarkable.  CXR without infiltrate, effusion, pneumothorax, or fracture/dislocation. EKG shows sinus rhythm.  Patient given Toradol and Maalox with significant symptom improvement on reassessment.  Low risk heart score of 1, EKG without obvious ischemia, delta troponin negative, doubt ACS. Patient is low risk wells, PERC negative, doubt pulmonary embolism.  Pain is not a  tearing sensation, symmetric pulses, no widening of mediastinum on CXR, doubt dissection. Cardiac monitor reviewed, no notable arrhythmias or tachycardia. Patient has appeared hemodynamically stable throughout ER visit and appears safe for discharge with close PCP and neurology follow up. I discussed results, treatment plan, and return precautions with the patient. Provided opportunity for questions, patient confirmed understanding and is in agreement with plan.    Portions of this note were generated with Scientist, clinical (histocompatibility and immunogenetics). Dictation errors may occur despite best attempts at proofreading.    Final Clinical Impression(s) / ED Diagnoses Final diagnoses:  Atypical chest pain    Rx / DC Orders ED Discharge Orders     None        Kandice Hams 10/05/20 1327    Gwyneth Sprout, MD 10/08/20 1636
